# Patient Record
Sex: Male | Born: 1990 | Race: Black or African American | Hispanic: No | Marital: Single | Smoking: Never smoker
Health system: Southern US, Community
[De-identification: ages and names within clinical notes are randomized; demographics above are authoritative.]

## PROBLEM LIST (undated history)

## (undated) DIAGNOSIS — T7840XA Allergy, unspecified, initial encounter: Secondary | ICD-10-CM

---
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
## 2020-11-21 ENCOUNTER — Ambulatory Visit
Admission: EM | Admit: 2020-11-21 | Discharge: 2020-11-21 | Disposition: A | Discharge status: Home / Self Care | Payer: Self-pay | Attending: Internal Medicine | Admitting: Internal Medicine

## 2020-11-21 ENCOUNTER — Encounter: Payer: Self-pay | Admitting: Emergency Medicine

## 2020-11-21 ENCOUNTER — Other Ambulatory Visit: Payer: Self-pay

## 2020-11-21 ENCOUNTER — Ambulatory Visit (INDEPENDENT_AMBULATORY_CARE_PROVIDER_SITE_OTHER): Payer: Self-pay

## 2020-11-21 DIAGNOSIS — G8929 Other chronic pain: Secondary | ICD-10-CM

## 2020-11-21 DIAGNOSIS — M79671 Pain in right foot: Secondary | ICD-10-CM

## 2020-11-21 HISTORY — DX: Allergy, unspecified, initial encounter: T78.40XA

## 2020-11-21 MED ORDER — DICLOFENAC SODIUM 1 % EX GEL: 2.0000 g | Freq: Four times a day (QID) | CUTANEOUS | 0 refills | Status: AC

## 2020-11-21 NOTE — Discharge Instructions (Signed)
You seem to have a bone spurr that could be causing the inflammation and pain. Follow up with a foot doctor

## 2020-11-21 NOTE — ED Provider Notes (Signed)
[facility]    CSN: [id number] Arrival date & time: 11/21/20  1225      History   Chief Complaint Chief Complaint  Patient presents with  . Foot Pain    HPI Gregory Martin is a 30 y.o. male who presents with R posterior heel pain. He states he came down wrong when jumping while playing basketball. Ones he started walking noticed pain on this area x 2 days. This area is painful even when not walking.     Past Medical History:  Diagnosis Date  . Allergies     There are no problems to display for this patient.   Past Surgical History:  Procedure Laterality Date  . KNEE SURGERY Right        Home Medications    Prior to Admission medications   Medication Sig Start Date End Date Taking? Authorizing Provider  diclofenac Sodium (VOLTAREN) 1 % GEL Apply 2 g topically 4 (four) times daily. 11/21/20  Yes Rodriguez-Southworth, Viviana Simpler    Family History History reviewed. No pertinent family history.  Social History Social History   Tobacco Use  . Smoking status: Never Smoker  . Smokeless tobacco: Never Used  Vaping Use  . Vaping Use: Some days  Substance Use Topics  . Alcohol use: Never  . Drug use: Never     Allergies   Patient has no known allergies.   Review of Systems Review of Systems  Musculoskeletal: Positive for arthralgias and gait problem.  Skin: Positive for color change. Negative for pallor, rash and wound.  Neurological: Negative for weakness and numbness.     Physical Exam Triage Vital Signs ED Triage Vitals  Enc Vitals Group     BP 11/21/20 1326 (!) 118/91     Pulse Rate 11/21/20 1326 63     Resp 11/21/20 1326 18     Temp 11/21/20 1326 98.3 F (36.8 C)     Temp Source 11/21/20 1326 Oral     SpO2 11/21/20 1326 100 %     Weight --      Height --      Head Circumference --      Peak Flow --      Pain Score 11/21/20 1323 9     Pain Loc --      Pain Edu? --      Excl. in GC? --    No data found.  Updated  Vital Signs BP (!) 118/91 (BP Location: Left Arm)   Pulse 63   Temp 98.3 F (36.8 C) (Oral)   Resp 18   SpO2 100%   Visual Acuity Right Eye Distance:   Left Eye Distance:   Bilateral Distance:    Right Eye Near:   Left Eye Near:    Bilateral Near:     Physical Exam Vitals and nursing note reviewed.  Constitutional:      General: He is not in acute distress.    Appearance: He is not toxic-appearing.  HENT:     Right Ear: External ear normal.     Left Ear: External ear normal.  Eyes:     Conjunctiva/sclera: Conjunctivae normal.  Pulmonary:     Effort: Pulmonary effort is normal.  Musculoskeletal:        General: Normal range of motion.     Cervical back: Neck supple.     Comments: R foot- with local tenderness on the bony prominence where the achillis attaches. This area is a little swollen compared to  the LJanee Morn test is neg. Was not able to walk on his tip toes due to pain, able to walk on heels a little with some pain.   Skin:    General: Skin is warm and dry.     Findings: No bruising, erythema or rash.  Neurological:     Mental Status: He is alert and oriented to person, place, and time.     Gait: Gait normal.  Psychiatric:        Mood and Affect: Mood normal.        Behavior: Behavior normal.        Thought Content: Thought content normal.        Judgment: Judgment normal.      UC Treatments / Results  Labs (all labs ordered are listed, but only abnormal results are displayed) Labs Reviewed - No data to display  EKG   Radiology DG Foot Complete Right  Result Date: 11/21/2020 CLINICAL DATA:  Posterior heel pain. EXAM: RIGHT FOOT COMPLETE - 3+ VIEW COMPARISON:  None. FINDINGS: No fracture. No subluxation or dislocation. Dystrophic calcification/enthesopathy noted at the Achilles insertion. No suspicious lytic or sclerotic osseous abnormality. IMPRESSION: No acute bony abnormality. Dystrophic calcification/enthesopathy at the Achilles insertion site.  Electronically Signed   By: Kennith Center M.D.   On: 11/21/2020 14:51    Procedures Procedures (including critical care time)  Medications Ordered in UC Medications - No data to display  Initial Impression / Assessment and Plan / UC Course  I have reviewed the triage vital signs and the nursing notes. Pertinent  imaging results that were available during my care of the patient were reviewed by me and considered in my medical decision making (see chart for details). R heel pain. Placed on Voltaren gel and needs to FU with podiatry.   Final Clinical Impressions(s) / UC Diagnoses   Final diagnoses:  Heel pain, chronic, right     Discharge Instructions     You seem to have a bone spurr that could be causing the inflammation and pain. Follow up with a foot doctor     ED Prescriptions    Medication Sig Dispense Auth. Provider   diclofenac Sodium (VOLTAREN) 1 % GEL Apply 2 g topically 4 (four) times daily. 150 g Rodriguez-Southworth, Nettie Elm, PA-C     PDMP not reviewed this encounter.   Garey Ham, PA-C 11/21/20 1711

## 2020-11-21 NOTE — ED Triage Notes (Signed)
Pt is present today with right foot pain. Pt states that this past weekend he was basket ball and came down wrong on his foot and noticed pain afterwards. Pt denies any swelling or discoloration

## 2021-01-03 ENCOUNTER — Inpatient Hospital Stay: Payer: Self-pay | Admitting: Family Medicine

## 2021-06-26 ENCOUNTER — Other Ambulatory Visit: Payer: Self-pay

## 2021-06-26 ENCOUNTER — Encounter (HOSPITAL_COMMUNITY): Payer: Self-pay | Admitting: *Deleted

## 2021-06-26 ENCOUNTER — Emergency Department (HOSPITAL_COMMUNITY)
Admission: EM | Admit: 2021-06-26 | Discharge: 2021-06-26 | Disposition: A | Discharge status: Home / Self Care | Payer: Self-pay | Attending: Emergency Medicine | Admitting: Emergency Medicine

## 2021-06-26 DIAGNOSIS — U071 COVID-19: Secondary | ICD-10-CM

## 2021-06-26 LAB — RESP PANEL BY RT-PCR (FLU A&B, COVID) ARPGX2
Influenza A by PCR: NEGATIVE
Influenza B by PCR: NEGATIVE
SARS Coronavirus 2 by RT PCR: POSITIVE — AB

## 2021-06-26 MED ORDER — BENZONATATE 100 MG PO CAPS: 100.0000 mg | ORAL_CAPSULE | Freq: Three times a day (TID) | ORAL | 0 refills | Status: AC

## 2021-06-26 MED ORDER — FLUTICASONE PROPIONATE 50 MCG/ACT NA SUSP: 2.0000 | Freq: Every day | NASAL | 0 refills | Status: AC

## 2021-06-26 NOTE — ED Provider Notes (Signed)
[facility] EMERGENCY DEPARTMENT Provider Note   CSN: [id number] Arrival date & time: 06/26/21  1108     History Chief Complaint  Patient presents with   Cough    Gregory Martin is a 30 y.o. male.  30 year old male presents today for evaluation of cough, sinus congestion several day duration.  Patient has not been drinking adequate amount of fluids.  Does report lightheadedness with positional change.  Denies recent sick contacts.  The history is provided by the patient. No language interpreter was used.      Past Medical History:  Diagnosis Date   Allergies     There are no problems to display for this patient.   Past Surgical History:  Procedure Laterality Date   KNEE SURGERY Right        No family history on file.  Social History   Tobacco Use   Smoking status: Never   Smokeless tobacco: Never  Vaping Use   Vaping Use: Some days  Substance Use Topics   Alcohol use: Never   Drug use: Never    Home Medications Prior to Admission medications   Medication Sig Start Date End Date Taking? Authorizing Provider  benzonatate (TESSALON) 100 MG capsule Take 1 capsule (100 mg total) by mouth every 8 (eight) hours. 06/26/21  Yes Jerauld Bostwick, PA-C  fluticasone (FLONASE) 50 MCG/ACT nasal spray Place 2 sprays into both nostrils daily. 06/26/21  Yes Terita Hejl, PA-C  diclofenac Sodium (VOLTAREN) 1 % GEL Apply 2 g topically 4 (four) times daily. 11/21/20   Rodriguez-Southworth, Nettie Elm, PA-C    Allergies    Patient has no known allergies.  Review of Systems   Review of Systems  Constitutional:  Positive for chills. Negative for activity change and fever.  Respiratory:  Positive for cough. Negative for shortness of breath.   Cardiovascular:  Negative for chest pain.  Gastrointestinal:  Negative for nausea and vomiting.  Neurological:  Positive for light-headedness (Only with position change). Negative for weakness.  All other systems reviewed and are  negative.  Physical Exam Updated Vital Signs BP 134/90 (BP Location: Right Arm)    Pulse 83    Temp 98.5 F (36.9 C) (Oral)    Resp 16    Ht 5\' 11"  (1.803 m)    Wt 108.9 kg    SpO2 98%    BMI 33.47 kg/m   Physical Exam Vitals and nursing note reviewed.  Constitutional:      General: He is not in acute distress.    Appearance: Normal appearance. He is not ill-appearing.  HENT:     Head: Normocephalic and atraumatic.     Nose: Nose normal.  Eyes:     Conjunctiva/sclera: Conjunctivae normal.  Cardiovascular:     Rate and Rhythm: Normal rate and regular rhythm.  Pulmonary:     Effort: Pulmonary effort is normal. No respiratory distress.     Breath sounds: No wheezing or rales.  Musculoskeletal:        General: No deformity.  Skin:    Findings: No rash.  Neurological:     Mental Status: He is alert.    ED Results / Procedures / Treatments   Labs (all labs ordered are listed, but only abnormal results are displayed) Labs Reviewed  RESP PANEL BY RT-PCR (FLU A&B, COVID) ARPGX2 - Abnormal; Notable for the following components:      Result Value   SARS Coronavirus 2 by RT PCR POSITIVE (*)    All other components within  normal limits    EKG None  Radiology No results found.  Procedures Procedures   Medications Ordered in ED Medications - No data to display  ED Course  I have reviewed the triage vital signs and the nursing notes.  Pertinent labs & imaging results that were available during my care of the patient were reviewed by me and considered in my medical decision making (see chart for details).    MDM Rules/Calculators/A&P                         30 year old male presents with chills, lightheadedness with position change, cough of few day duration.  Patient's respiratory panel is positive for COVID.  Symptomatic treatment discussed.  Discussed importance of adequately hydrating.  Patient voices understanding and is in agreement with plan.  Return precautions  discussed.     Final Clinical Impression(s) / ED Diagnoses Final diagnoses:  COVID-19    Rx / DC Orders ED Discharge Orders          Ordered    fluticasone (FLONASE) 50 MCG/ACT nasal spray  Daily        06/26/21 1452    benzonatate (TESSALON) 100 MG capsule  Every 8 hours        06/26/21 1452             Marita [place], PA-C 06/26/21 1713    Mancel Bale, MD 06/26/21 [id number]

## 2021-06-26 NOTE — ED Triage Notes (Signed)
Pt c/o headache, chills, lightheadedness with moving quickly, body aches, productive cough x 3-4 days.

## 2021-06-26 NOTE — Discharge Instructions (Addendum)
Your respiratory panel is positive for COVID.  I have sent in Flonase for your congestion you can take Zyrtec or Allegra over-the-counter for that as well.  I have also sent in [facility] for your cough.  If you develop fever start taking Tylenol and Motrin for fever control.  If your symptoms worsen, return to the emergency room.  This can take 5 to 7 days to run its course.

## 2022-05-13 IMAGING — CR DG FOOT COMPLETE 3+V*R*
3 series · 3 of 3 positions shown · non-contrast
Comparison: None.

CLINICAL DATA: Posterior heel pain.

EXAM:
RIGHT FOOT COMPLETE - 3+ VIEW

[foot ap]
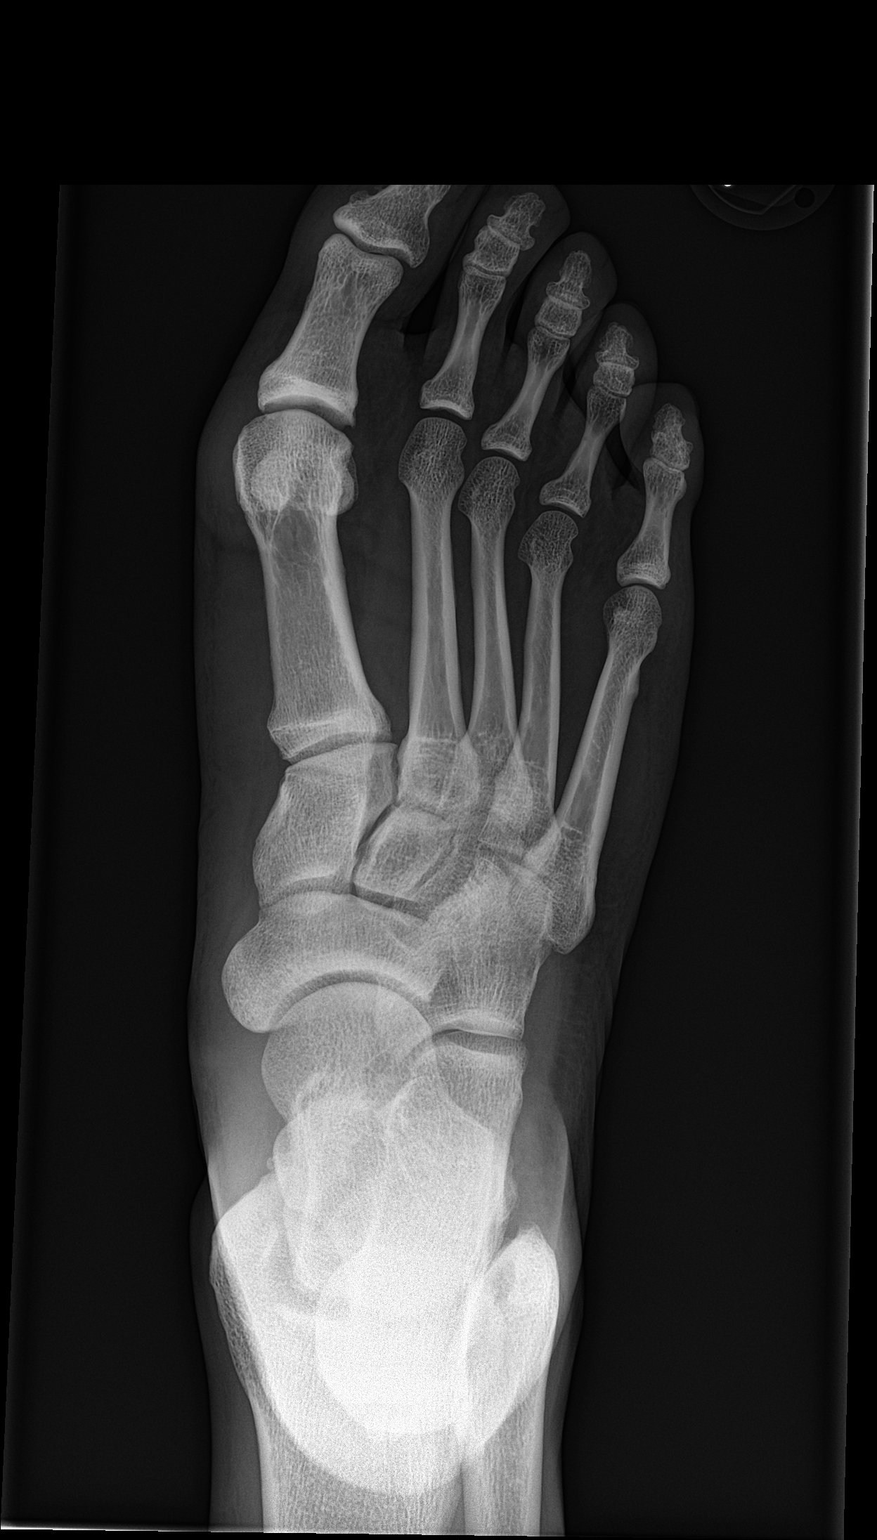

[foot obl]
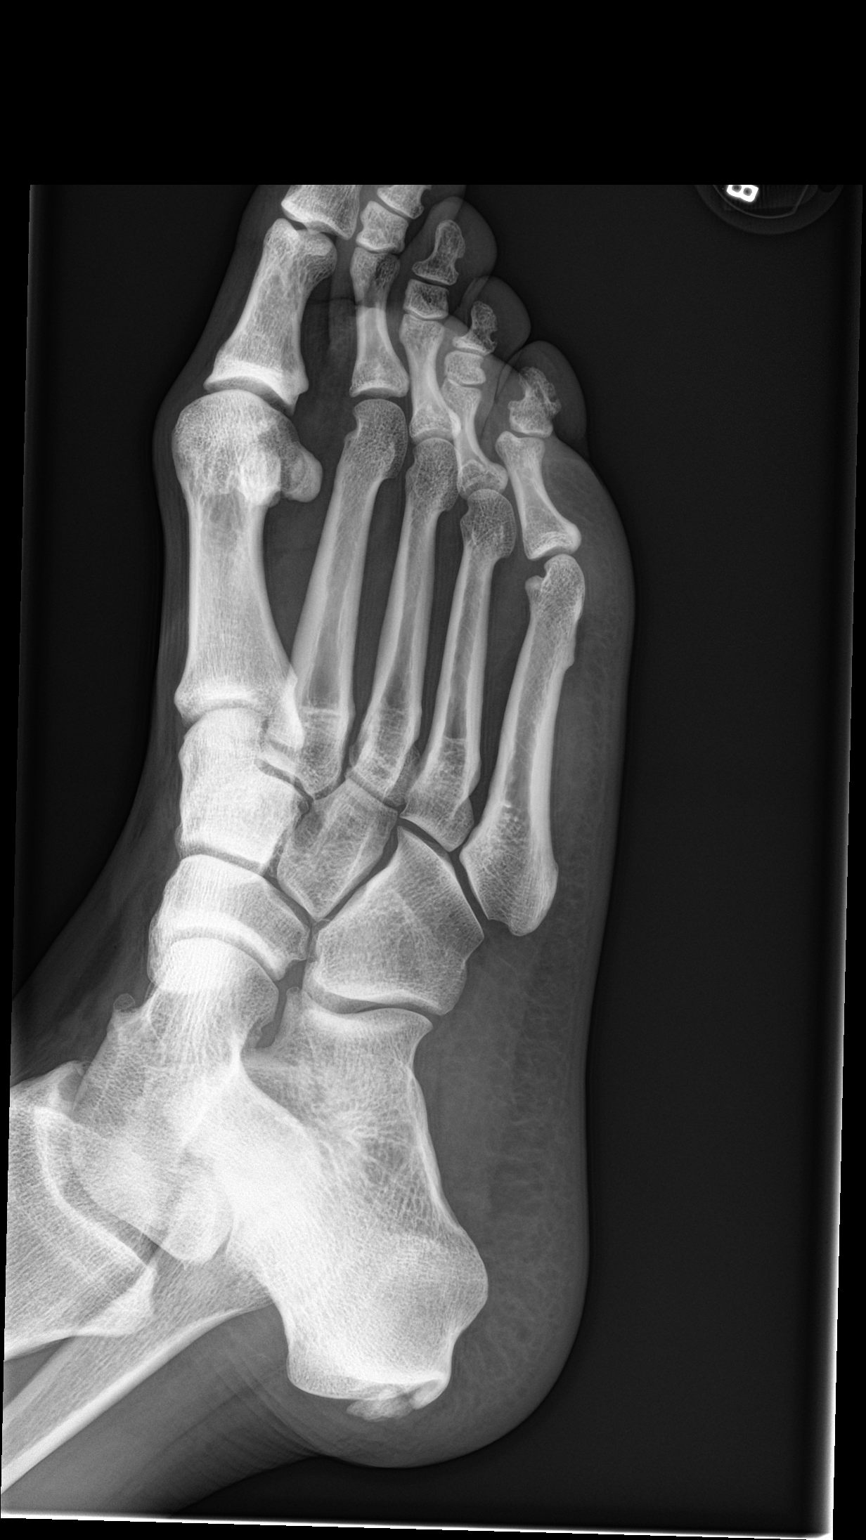

[foot lat]
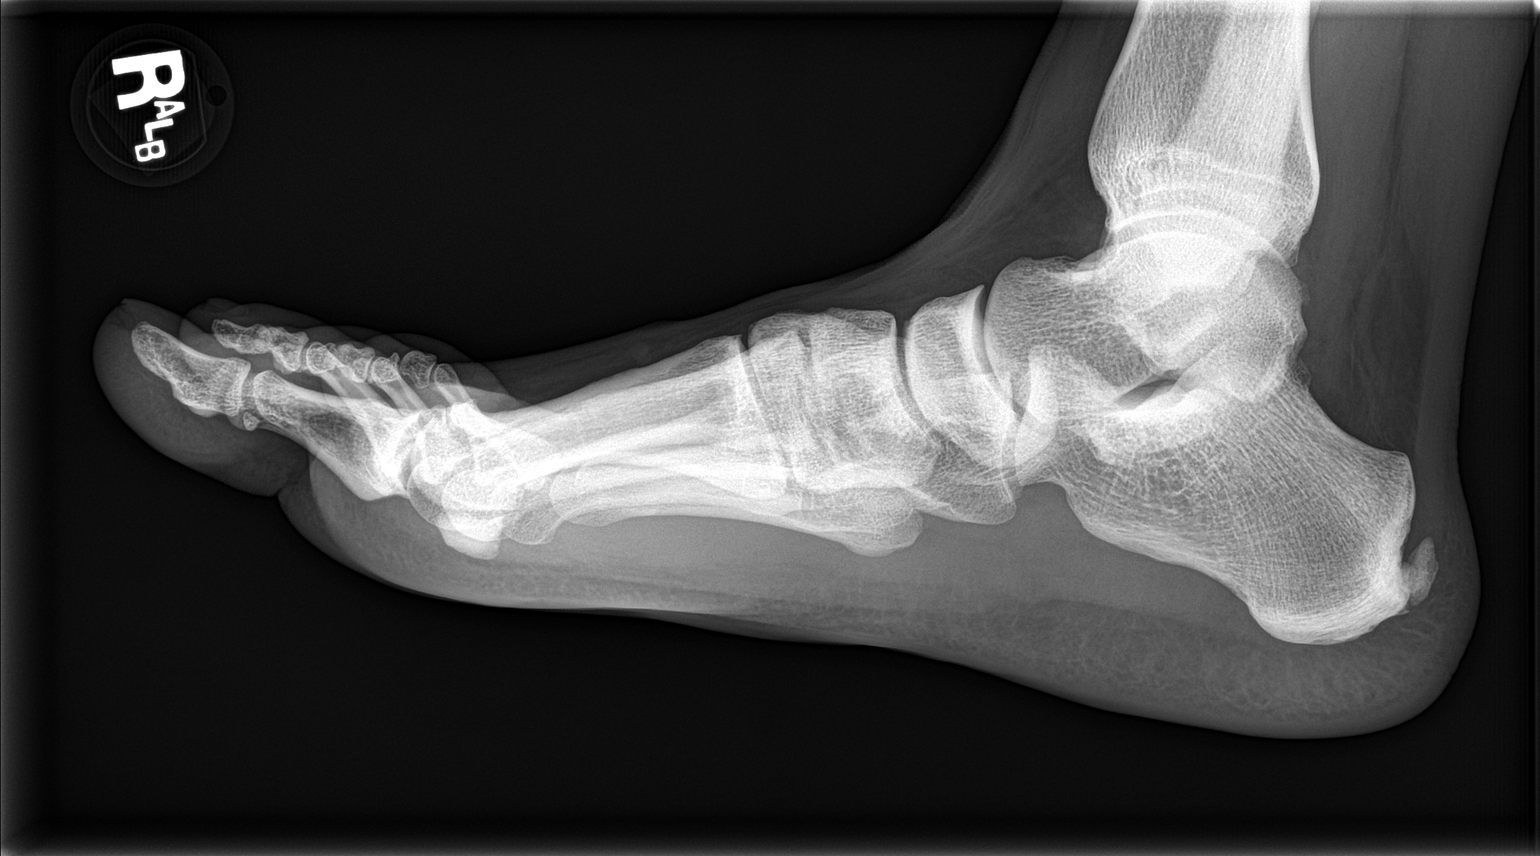

[3 of 3 positions shown; findings below may reference images not displayed]

FINDINGS: No fracture. No subluxation or dislocation. Dystrophic
calcification/enthesopathy noted at the Achilles insertion. No
suspicious lytic or sclerotic osseous abnormality.
IMPRESSION: No acute bony abnormality.

Dystrophic calcification/enthesopathy at the Achilles insertion
site.
